# Patient Record
Sex: Female | Born: 1965 | Race: White | Hispanic: No | Smoking: Never smoker
Health system: Southern US, Community
[De-identification: ages and names within clinical notes are randomized; demographics above are authoritative.]

## PROBLEM LIST (undated history)

## (undated) DIAGNOSIS — C569 Malignant neoplasm of unspecified ovary: Secondary | ICD-10-CM

## (undated) HISTORY — PX: ABDOMINAL HYSTERECTOMY: SHX81

## (undated) HISTORY — PX: OTHER SURGICAL HISTORY: SHX169

## (undated) HISTORY — PX: BUNIONECTOMY: SHX129

---

## 2015-02-01 ENCOUNTER — Encounter (HOSPITAL_COMMUNITY): Payer: Self-pay | Admitting: *Deleted

## 2015-02-01 ENCOUNTER — Emergency Department (HOSPITAL_COMMUNITY): Payer: Managed Care, Other (non HMO)

## 2015-02-01 ENCOUNTER — Emergency Department (HOSPITAL_COMMUNITY)
Admission: EM | Admit: 2015-02-01 | Discharge: 2015-02-01 | Disposition: A | Discharge status: Home / Self Care | Payer: Managed Care, Other (non HMO) | Attending: Emergency Medicine | Admitting: Emergency Medicine

## 2015-02-01 DIAGNOSIS — Y9301 Activity, walking, marching and hiking: Secondary | ICD-10-CM | POA: Insufficient documentation

## 2015-02-01 DIAGNOSIS — Z8543 Personal history of malignant neoplasm of ovary: Secondary | ICD-10-CM | POA: Diagnosis not present

## 2015-02-01 DIAGNOSIS — S93401A Sprain of unspecified ligament of right ankle, initial encounter: Secondary | ICD-10-CM

## 2015-02-01 DIAGNOSIS — Y9289 Other specified places as the place of occurrence of the external cause: Secondary | ICD-10-CM | POA: Insufficient documentation

## 2015-02-01 DIAGNOSIS — S99911A Unspecified injury of right ankle, initial encounter: Secondary | ICD-10-CM | POA: Diagnosis present

## 2015-02-01 DIAGNOSIS — W108XXA Fall (on) (from) other stairs and steps, initial encounter: Secondary | ICD-10-CM | POA: Diagnosis not present

## 2015-02-01 DIAGNOSIS — Y998 Other external cause status: Secondary | ICD-10-CM | POA: Diagnosis not present

## 2015-02-01 HISTORY — DX: Malignant neoplasm of unspecified ovary: C56.9

## 2015-02-01 MED ORDER — IBUPROFEN 800 MG PO TABS: 800.0000 mg | ORAL_TABLET | Freq: Three times a day (TID) | ORAL | Status: AC

## 2015-02-01 MED ORDER — IBUPROFEN 800 MG PO TABS
800.0000 mg | ORAL_TABLET | Freq: Once | ORAL | Status: AC
  Administered 2015-02-01: 800 mg via ORAL
  Filled 2015-02-01: qty 1

## 2015-02-01 NOTE — ED Notes (Signed)
Pt reports fall down stairs today, c/o bilateral ankle pain, R worse than L. Swelling present. Reports hitting head as well but no LOC.

## 2015-02-01 NOTE — ED Provider Notes (Signed)
CSN: [id number]     Arrival date & time 02/01/15  1830 History  This chart was scribed for non-physician practitioner, Otelia Santee, PA-C working with Deno Etienne, DO by Rayna Sexton, ED scribe. This patient was seen in room [place] and the patient's care was started at 9:12 PM.   Chief Complaint  Patient presents with  . Ankle Pain   The history is provided by the patient. No language interpreter was used.    HPI Comments: Donna Lawrence is a 49 y.o. female who presents to the Emergency Department complaining of a fall that occurred 1 day ago. Pt notes that she fell while walking down a flight of carpeted stairs and landed in sitting position on the hardwood floors below. She notes associated, diffuse, pain and significant swelling to her right ankle and further notes that she has been ambulatory since the incident. Pt denies any head injury, back pain, abd pain or CP.  Past Medical History  Diagnosis Date  . Ovarian cancer    Past Surgical History  Procedure Laterality Date  . Cesarean section    . Abdominal hysterectomy    . Plantar facitis surgeries     . Bunionectomy     No family history on file. Social History  Substance Use Topics  . Smoking status: Never Smoker   . Smokeless tobacco: None  . Alcohol Use: No   OB History    No data available     Review of Systems  Cardiovascular: Negative for chest pain.  Gastrointestinal: Negative for abdominal pain.  Musculoskeletal: Positive for joint swelling and arthralgias. Negative for back pain.  Skin: Negative for wound.  Neurological: Negative for syncope and headaches.    Allergies  Codeine  Home Medications   Prior to Admission medications   Not on File   BP 109/76 mmHg  Pulse 83  Temp(Src) 98.5 F (36.9 C) (Oral)  Resp 14  SpO2 93% Physical Exam  Constitutional: She is oriented to person, place, and time. She appears well-developed and well-nourished. No distress.  HENT:  Head: Normocephalic and  atraumatic.  Mouth/Throat: Oropharynx is clear and moist.  Eyes: Conjunctivae and EOM are normal. Pupils are equal, round, and reactive to light.  Neck: Normal range of motion. Neck supple. No tracheal deviation present.  Cardiovascular: Normal rate and intact distal pulses.   Pulmonary/Chest: Effort normal.  Abdominal: Soft. There is no tenderness.  Musculoskeletal: She exhibits edema.  No midline cervical or other spinal tenderness. Right ankle significantly swollen circumferentially; no bony deformity; complete plantar flexion and dorsiflexion. Left ankle minimally swollen with FROM. Mildly tender.  Neurological: She is alert and oriented to person, place, and time.  Skin: Skin is warm and dry.  Psychiatric: She has a normal mood and affect. Her behavior is normal.  Nursing note and vitals reviewed.   ED Course  Procedures  DIAGNOSTIC STUDIES: Oxygen Saturation is 93% on RA, adequate by my interpretation.    COORDINATION OF CARE: 9:16 PM Discussed treatment plan with pt at bedside and pt agreed to plan.  Labs Review Labs Reviewed - No data to display  Imaging Review Dg Ankle Complete Left  02/01/2015   CLINICAL DATA:  Bilateral ankle pain and swelling after falling down stairs today.  EXAM: LEFT ANKLE COMPLETE - 3+ VIEW  COMPARISON:  None.  FINDINGS: There is no fracture or dislocation. There are old avulsion is of the tips of the medial and lateral malleoli soft tissue swelling around the ankle. No appreciable  ankle joint effusion. As well as small degenerative osteophytes on the distal tibia.  IMPRESSION: No acute osseous abnormality.  Soft tissue swelling.   Electronically Signed   By: Lorriane Shire M.D.   On: 02/01/2015 19:33   Dg Ankle Complete Right  02/01/2015   CLINICAL DATA:  Bilateral ankle pain and swelling after falling down stairs today. Initial encounter.  EXAM: RIGHT ANKLE - COMPLETE 3+ VIEW  COMPARISON:  None.  FINDINGS: The mineralization and alignment are normal.  There is no evidence of acute fracture or dislocation. The joint spaces are maintained. Moderate soft tissue swelling around the ankle, greatest laterally.  IMPRESSION: No acute osseous findings.  Soft tissue swelling.   Electronically Signed   By: Richardean Sale M.D.   On: 02/01/2015 19:31     EKG Interpretation None      MDM   Final diagnoses:  None    1. Right ankle sprain  Uncomplicated non-fracture ankle injury requiring supportive management.   I personally performed the services described in this documentation, which was scribed in my presence. The recorded information has been reviewed and is accurate.     Charlann Lange, PA-C 02/06/15 [place], DO 02/06/15 Bosie Helper

## 2015-02-01 NOTE — Discharge Instructions (Signed)
Ankle Sprain °An ankle sprain is an injury to the strong, fibrous tissues (ligaments) that hold the bones of your ankle joint together.  °CAUSES °An ankle sprain is usually caused by a fall or by twisting your ankle. Ankle sprains most commonly occur when you step on the outer edge of your foot, and your ankle turns inward. People who participate in sports are more prone to these types of injuries.  °SYMPTOMS  °· Pain in your ankle. The pain may be present at rest or only when you are trying to stand or walk. °· Swelling. °· Bruising. Bruising may develop immediately or within 1 to 2 days after your injury. °· Difficulty standing or walking, particularly when turning corners or changing directions. °DIAGNOSIS  °Your caregiver will ask you details about your injury and perform a physical exam of your ankle to determine if you have an ankle sprain. During the physical exam, your caregiver will press on and apply pressure to specific areas of your foot and ankle. Your caregiver will try to move your ankle in certain ways. An X-ray exam may be done to be sure a bone was not broken or a ligament did not separate from one of the bones in your ankle (avulsion fracture).  °TREATMENT  °Certain types of braces can help stabilize your ankle. Your caregiver can make a recommendation for this. Your caregiver may recommend the use of medicine for pain. If your sprain is severe, your caregiver may refer you to a surgeon who helps to restore function to parts of your skeletal system (orthopedist) or a physical therapist. °HOME CARE INSTRUCTIONS  °· Apply ice to your injury for 1-2 days or as directed by your caregiver. Applying ice helps to reduce inflammation and pain. °· Put ice in a plastic bag. °· Place a towel between your skin and the bag. °· Leave the ice on for 15-20 minutes at a time, every 2 hours while you are awake. °· Only take over-the-counter or prescription medicines for pain, discomfort, or fever as directed by  your caregiver. °· Elevate your injured ankle above the level of your heart as much as possible for 2-3 days. °· If your caregiver recommends crutches, use them as instructed. Gradually put weight on the affected ankle. Continue to use crutches or a cane until you can walk without feeling pain in your ankle. °· If you have a plaster splint, wear the splint as directed by your caregiver. Do not rest it on anything harder than a pillow for the first 24 hours. Do not put weight on it. Do not get it wet. You may take it off to take a shower or bath. °· You may have been given an elastic bandage to wear around your ankle to provide support. If the elastic bandage is too tight (you have numbness or tingling in your foot or your foot becomes cold and blue), adjust the bandage to make it comfortable. °· If you have an air splint, you may blow more air into it or let air out to make it more comfortable. You may take your splint off at night and before taking a shower or bath. Wiggle your toes in the splint several times per day to decrease swelling. °SEEK MEDICAL CARE IF:  °· You have rapidly increasing bruising or swelling. °· Your toes feel extremely cold or you lose feeling in your foot. °· Your pain is not relieved with medicine. °SEEK IMMEDIATE MEDICAL CARE IF: °· Your toes are numb or blue. °·   You have severe pain that is increasing. MAKE SURE YOU:   Understand these instructions.  Will watch your condition.  Will get help right away if you are not doing well or get worse. Document Released: 06/10/2005 Document Revised: 03/04/2012 Document Reviewed: 06/22/2011 [facility] Patient Information 2015 [place], [place]. This information is not intended to replace advice given to you by your health care provider. Make sure you discuss any questions you have with your health care provider. Cryotherapy Cryotherapy means treatment with cold. Ice or gel packs can be used to reduce both pain and swelling. Ice is the most  helpful within the first 24 to 48 hours after an injury or flare-up from overusing a muscle or joint. Sprains, strains, spasms, burning pain, shooting pain, and aches can all be eased with ice. Ice can also be used when recovering from surgery. Ice is effective, has very few side effects, and is safe for most people to use. PRECAUTIONS  Ice is not a safe treatment option for people with:  Raynaud phenomenon. This is a condition affecting small blood vessels in the extremities. Exposure to cold may cause your problems to return.  Cold hypersensitivity. There are many forms of cold hypersensitivity, including:  Cold urticaria. Red, itchy hives appear on the skin when the tissues begin to warm after being iced.  Cold erythema. This is a red, itchy rash caused by exposure to cold.  Cold hemoglobinuria. Red blood cells break down when the tissues begin to warm after being iced. The hemoglobin that carry oxygen are passed into the urine because they cannot combine with blood proteins fast enough.  Numbness or altered sensitivity in the area being iced. If you have any of the following conditions, do not use ice until you have discussed cryotherapy with your caregiver:  Heart conditions, such as arrhythmia, angina, or chronic heart disease.  High blood pressure.  Healing wounds or open skin in the area being iced.  Current infections.  Rheumatoid arthritis.  Poor circulation.  Diabetes. Ice slows the blood flow in the region it is applied. This is beneficial when trying to stop inflamed tissues from spreading irritating chemicals to surrounding tissues. However, if you expose your skin to cold temperatures for too long or without the proper protection, you can damage your skin or nerves. Watch for signs of skin damage due to cold. HOME CARE INSTRUCTIONS Follow these tips to use ice and cold packs safely.  Place a dry or damp towel between the ice and skin. A damp towel will cool the skin  more quickly, so you may need to shorten the time that the ice is used.  For a more rapid response, add gentle compression to the ice.  Ice for no more than 10 to 20 minutes at a time. The bonier the area you are icing, the less time it will take to get the benefits of ice.  Check your skin after 5 minutes to make sure there are no signs of a poor response to cold or skin damage.  Rest 20 minutes or more between uses.  Once your skin is numb, you can end your treatment. You can test numbness by very lightly touching your skin. The touch should be so light that you do not see the skin dimple from the pressure of your fingertip. When using ice, most people will feel these normal sensations in this order: cold, burning, aching, and numbness.  Do not use ice on someone who cannot communicate their responses to pain,  such as small children or people with dementia. HOW TO MAKE AN ICE PACK Ice packs are the most common way to use ice therapy. Other methods include ice massage, ice baths, and cryosprays. Muscle creams that cause a cold, tingly feeling do not offer the same benefits that ice offers and should not be used as a substitute unless recommended by your caregiver. To make an ice pack, do one of the following:  Place crushed ice or a bag of frozen vegetables in a sealable plastic bag. Squeeze out the excess air. Place this bag inside another plastic bag. Slide the bag into a pillowcase or place a damp towel between your skin and the bag.  Mix 3 parts water with 1 part rubbing alcohol. Freeze the mixture in a sealable plastic bag. When you remove the mixture from the freezer, it will be slushy. Squeeze out the excess air. Place this bag inside another plastic bag. Slide the bag into a pillowcase or place a damp towel between your skin and the bag. SEEK MEDICAL CARE IF:  You develop white spots on your skin. This may give the skin a blotchy (mottled) appearance.  Your skin turns blue or  pale.  Your skin becomes waxy or hard.  Your swelling gets worse. MAKE SURE YOU:   Understand these instructions.  Will watch your condition.  Will get help right away if you are not doing well or get worse. Document Released: 02/04/2011 Document Revised: 10/25/2013 Document Reviewed: 02/04/2011 [facility] Patient Information 2015 [place], [place]. This information is not intended to replace advice given to you by your health care provider. Make sure you discuss any questions you have with your health care provider.

## 2016-05-14 IMAGING — CR DG ANKLE COMPLETE 3+V*R*
3 series · 3 of 3 positions shown · non-contrast
Comparison: None.

CLINICAL DATA: Bilateral ankle pain and swelling after falling down
stairs today. Initial encounter.

EXAM:
RIGHT ANKLE - COMPLETE 3+ VIEW

[x ankle ap right]
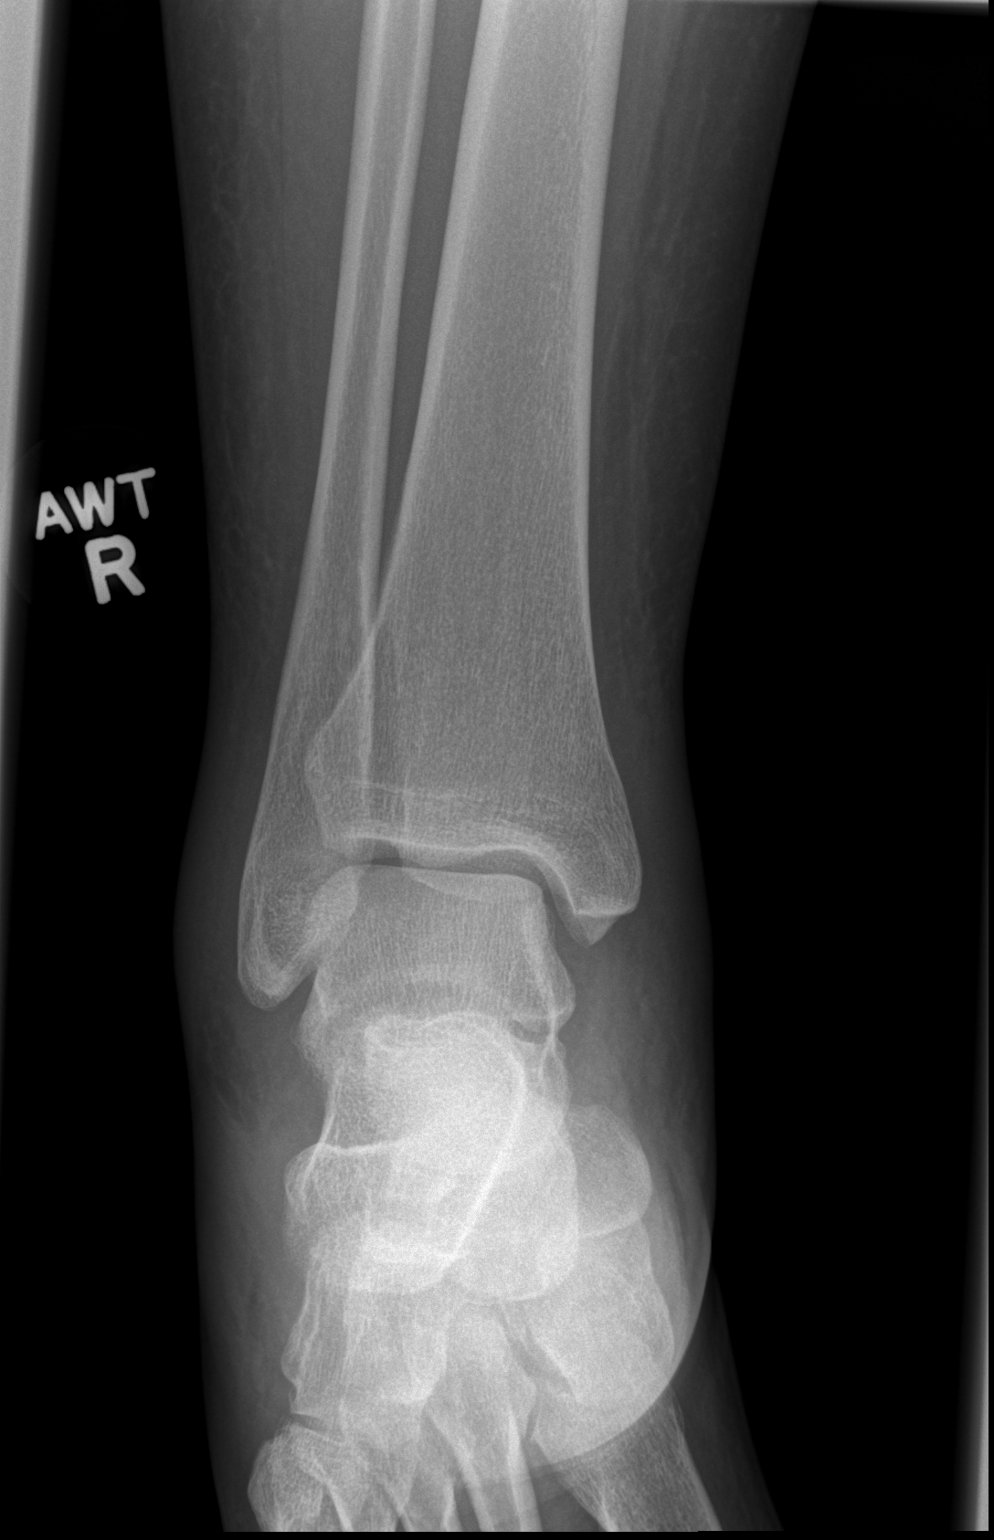

[x ankle obl right]
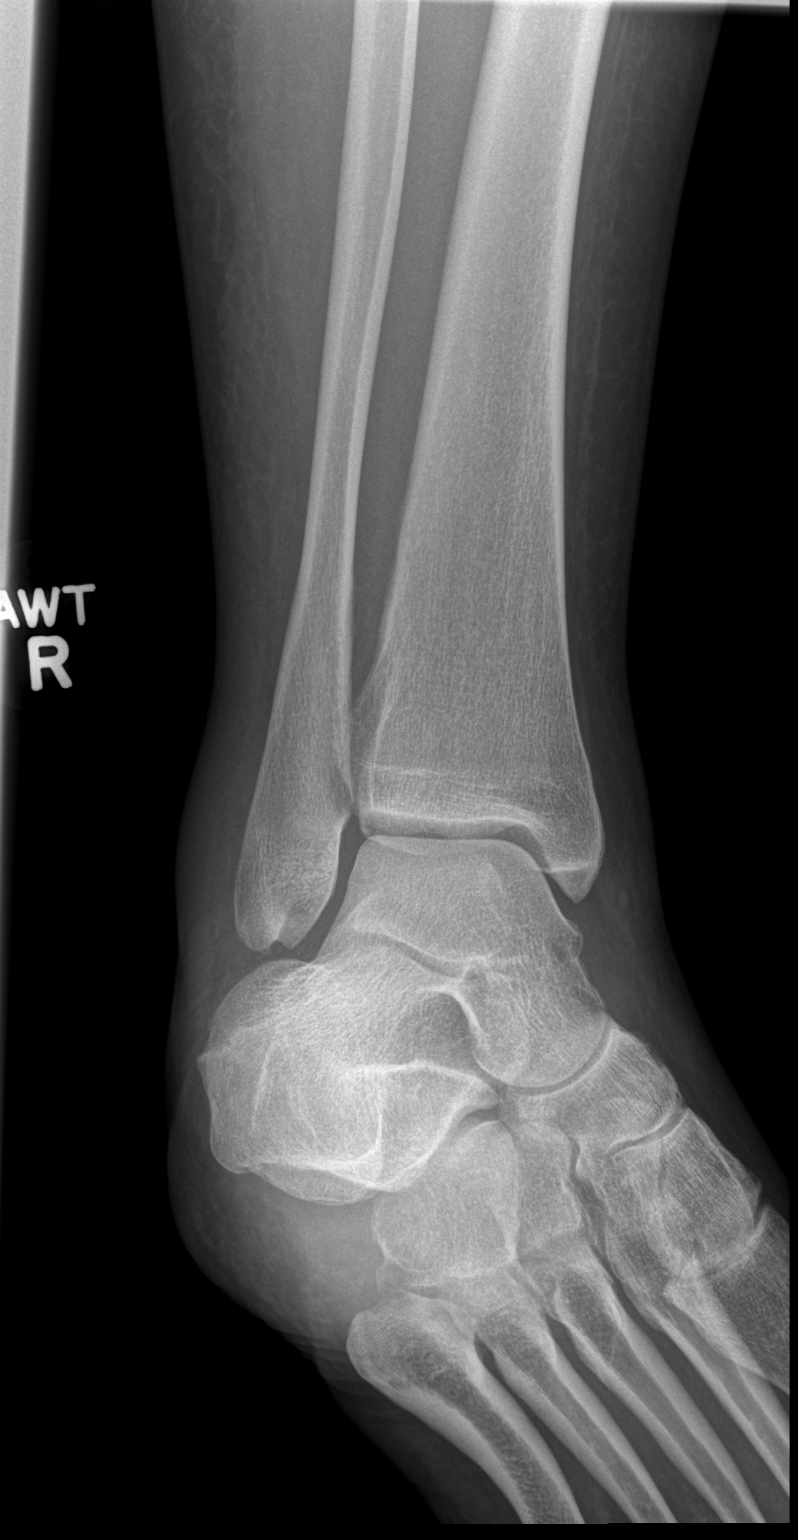

[x ankle lat right]
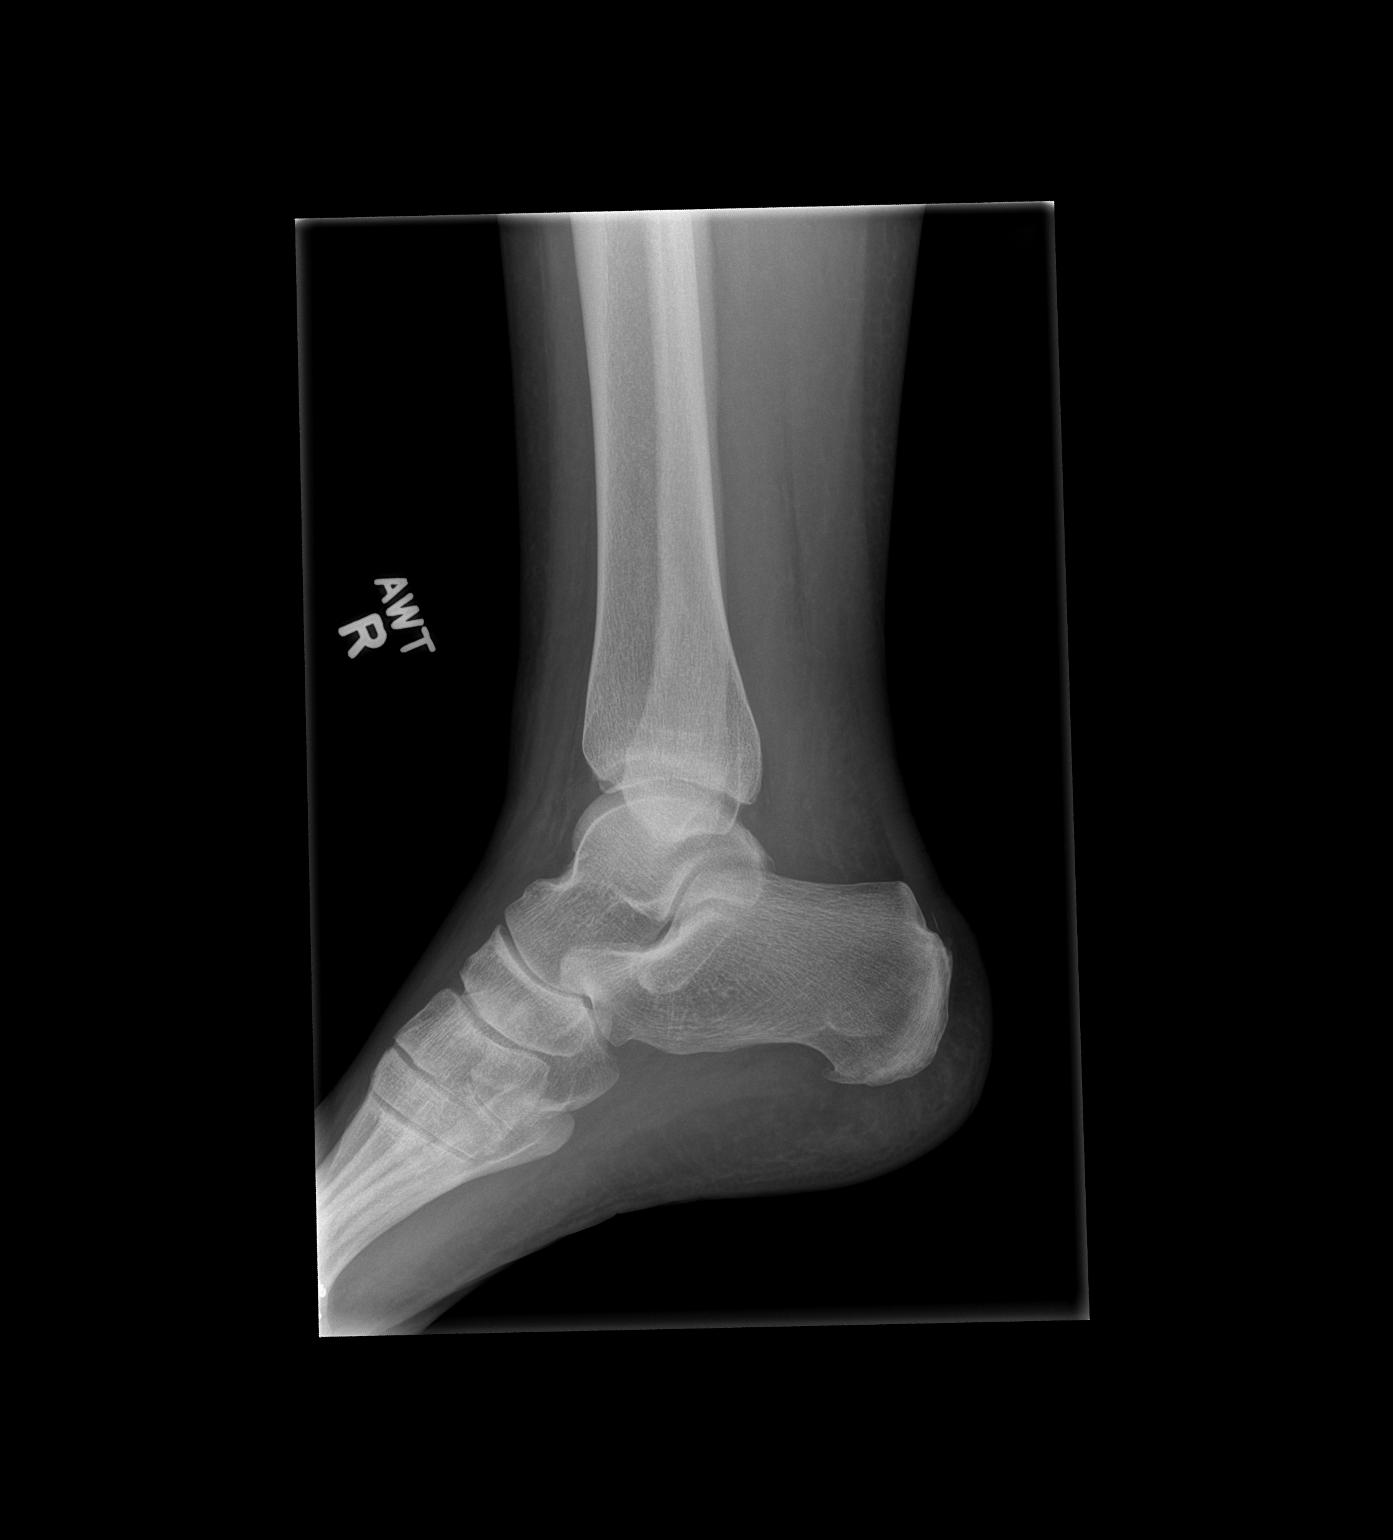

[3 of 3 positions shown; findings below may reference images not displayed]

FINDINGS: The mineralization and alignment are normal. There is no evidence of
acute fracture or dislocation. The joint spaces are maintained.
Moderate soft tissue swelling around the ankle, greatest laterally.
IMPRESSION: No acute osseous findings.  Soft tissue swelling.
